# Patient Record
Sex: Female | Born: 1980 | Race: White | Hispanic: No | Marital: Single | Smoking: Current every day smoker
Health system: Southern US, Community
[De-identification: ages and names within clinical notes are randomized; demographics above are authoritative.]

## PROBLEM LIST (undated history)

## (undated) DIAGNOSIS — F32A Depression, unspecified: Secondary | ICD-10-CM

## (undated) DIAGNOSIS — Z8041 Family history of malignant neoplasm of ovary: Secondary | ICD-10-CM

## (undated) DIAGNOSIS — F329 Major depressive disorder, single episode, unspecified: Secondary | ICD-10-CM

## (undated) DIAGNOSIS — Z803 Family history of malignant neoplasm of breast: Secondary | ICD-10-CM

## (undated) DIAGNOSIS — Z8042 Family history of malignant neoplasm of prostate: Secondary | ICD-10-CM

## (undated) DIAGNOSIS — R51 Headache: Secondary | ICD-10-CM

## (undated) DIAGNOSIS — Z800 Family history of malignant neoplasm of digestive organs: Secondary | ICD-10-CM

## (undated) DIAGNOSIS — F419 Anxiety disorder, unspecified: Secondary | ICD-10-CM

## (undated) DIAGNOSIS — R519 Headache, unspecified: Secondary | ICD-10-CM

## (undated) HISTORY — DX: Family history of malignant neoplasm of digestive organs: Z80.0

## (undated) HISTORY — DX: Family history of malignant neoplasm of breast: Z80.3

## (undated) HISTORY — PX: WISDOM TOOTH EXTRACTION: SHX21

## (undated) HISTORY — DX: Family history of malignant neoplasm of prostate: Z80.42

## (undated) HISTORY — DX: Family history of malignant neoplasm of ovary: Z80.41

---
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
## 2000-12-29 ENCOUNTER — Other Ambulatory Visit: Admission: RE | Admit: 2000-12-29 | Discharge: 2000-12-29 | Payer: Self-pay | Admitting: Obstetrics and Gynecology

## 2001-03-26 ENCOUNTER — Inpatient Hospital Stay (HOSPITAL_COMMUNITY): Admission: AD | Admit: 2001-03-26 | Discharge: 2001-03-26 | Payer: Self-pay | Admitting: Obstetrics and Gynecology

## 2001-05-11 ENCOUNTER — Inpatient Hospital Stay (HOSPITAL_COMMUNITY): Admission: AD | Admit: 2001-05-11 | Discharge: 2001-05-14 | Payer: Self-pay | Admitting: Obstetrics and Gynecology

## 2001-05-16 ENCOUNTER — Encounter: Admission: RE | Admit: 2001-05-16 | Discharge: 2001-06-15 | Payer: Self-pay | Admitting: Obstetrics and Gynecology

## 2004-01-02 ENCOUNTER — Other Ambulatory Visit: Admission: RE | Admit: 2004-01-02 | Discharge: 2004-01-02 | Payer: Self-pay | Admitting: *Deleted

## 2005-03-08 ENCOUNTER — Other Ambulatory Visit: Admission: RE | Admit: 2005-03-08 | Discharge: 2005-03-08 | Payer: Self-pay | Admitting: Family Medicine

## 2007-06-14 ENCOUNTER — Encounter: Admission: RE | Admit: 2007-06-14 | Discharge: 2007-06-14 | Payer: Self-pay | Admitting: Family Medicine

## 2007-08-10 ENCOUNTER — Emergency Department (HOSPITAL_COMMUNITY): Admission: EM | Admit: 2007-08-10 | Discharge: 2007-08-10 | Payer: Self-pay | Admitting: Emergency Medicine

## 2007-10-17 ENCOUNTER — Encounter: Admission: RE | Admit: 2007-10-17 | Discharge: 2007-10-17 | Payer: Self-pay | Admitting: Family Medicine

## 2010-08-23 ENCOUNTER — Encounter: Payer: Self-pay | Admitting: Family Medicine

## 2010-12-18 NOTE — H&P (Signed)
[facility]  Patient:    Sara [name], Sara [name] Visit Number: [id number] MRN: [id number]          Service Type: OBS Location: [facility] Attending Physician:  Jaymes Graff A Dictated by:   Saverio Danker, C.N.M. Admit Date:  05/11/2001                           History and Physical  HISTORY OF PRESENT ILLNESS:     Sara [name] is a 30 year old single white female gravida 1, para 0 at 40-1/7 weeks by ultrasound who presents complaining of uterine contractions every three minutes since about 5 p.m. today.  She denies any headaches or visual disturbances.  She reports positive fetal movement.  She reports vomiting several times this evening and some bloody show throughout the day after being examined in the office.  She was evaluated in the office for labor symptoms earlier and was 1 cm and about 70% effaced.  Her pregnancy has been followed at [facility] by the certified nurse midwife service and has been at risk for: (1) Late to care. (2) History of depression.  (3) First trimester bleeding.  Her group B strep is negative.  OBSTETRICAL HISTORY:            She is a prima gravida with a history of an last menstrual period of September 01, 2000, though her cycles have also been irregular and her [facility] by ultrasound on Dec 26, 2000, gave her an [facility] of May 10, 2001.  GYNECOLOGIC HISTORY:            She reports a history of bacterial vaginosis.  ALLERGIES:                      She has no known drug allergies.  PAST MEDICAL HISTORY:           She reports having had the usual childhood diseases.  She reports occasional urinary tract infections, history of migraines occasionally, history of depression in the past and has been on Prozac and Zoloft since 1998, prescribed by Dr. Jeanie Sewer.  She smoked prior to this pregnancy and has a history of using alcohol and marijuana in the past but none since knowing she was pregnant.  FAMILY HISTORY:                  Her family history is significant for multiple relatives with hypertension.  Father and maternal grandfather with diabetes. Maternal grandmother with rheumatoid arthritis.  Paternal grandfather with pancreatic cancer.  Maternal grandmother with breast cancer.  Multiple relatives with migraines.  GENETIC HISTORY:                Negative.  SOCIAL HISTORY:                 She is single.  Her mother is very involved and supportive.  The father of the baby is [employer], and he is also involved and supportive.  She is of the [place] faith.  She denies any illicit drug use, alcohol or smoking since knowing she was pregnant.  She admits to possibly being exposed prior to knowing she was pregnant.  PRENATAL LABORATORY DATA:       Her blood type is O positive.  Her antibody screen is negative.  Syphilis is nonreactive.  Rubella is positive.  Hepatitis B surface antigen is negative.  HIV is nonreactive.  GC and Chlamydia  are both negative.  Pap was within normal limits.  One hour Glucola is within normal range.  Maternal serum alpha fetoprotein is also within normal range.  Her 36 week beta strep was negative.  PHYSICAL EXAMINATION:  VITAL SIGNS:                    Her vital signs are stable.  She is afebrile.  HEENT:                          Grossly within normal limits.  HEART:                          Regular rate and rhythm.  CHEST:                          Clear.  BREASTS:                        Soft and nontender.  ABDOMEN:                        Gravid with uterine contractions every two minutes that are strong.  PELVIC:                         Her cervix is now 3 cm, 100%, vertex at a 0 station and with intact membranes.  She was 1.5 cm, 70% at 9:30 p.m.  EXTREMITIES:                    Her extremities are within normal limits.  ASSESSMENT:                     1. Intrauterine pregnancy at term.                                 2. Early labor.                                  3. Negative group B strep.                                 4. Nausea and vomiting.  PLAN:                           Admit to labor and delivery, to follow routine CNM orders and to give her IV fluids of LR with a 500 cc bolus. Dictated by:   Vance Gather Duplantis, C.N.M. Attending Physician:  Michael Litter DD:  05/11/01 TD:  05/11/01 Job: [id number]

## 2011-04-22 LAB — COMPREHENSIVE METABOLIC PANEL
ALT: 10
AST: 16
Albumin: 4.4
Alkaline Phosphatase: 64
BUN: 9
CO2: 27
Calcium: 9.7
Chloride: 103
Creatinine, Ser: 0.73
GFR calc Af Amer: >60
GFR calc non Af Amer: >60
Glucose, Bld: 88
Potassium: 3.8
Sodium: 139
Total Bilirubin: 1.1
Total Protein: 7.6

## 2011-04-22 LAB — DIFFERENTIAL
Basophils Absolute: 0
Basophils Relative: 0
Eosinophils Absolute: 0
Eosinophils Relative: 0
Lymphocytes Relative: 18
Lymphs Abs: 1.1
Monocytes Absolute: 0.3
Monocytes Relative: 4
Neutro Abs: 4.8
Neutrophils Relative %: 78 — ABNORMAL HIGH

## 2011-04-22 LAB — URINALYSIS, ROUTINE W REFLEX MICROSCOPIC
Glucose, UA: NEGATIVE
Hgb urine dipstick: NEGATIVE
Ketones, ur: 15 — AB
Nitrite: NEGATIVE
Protein, ur: NEGATIVE
Specific Gravity, Urine: 1.026
Urobilinogen, UA: 1
pH: 6.5

## 2011-04-22 LAB — CBC
HCT: 42.8
Hemoglobin: 14.8
MCHC: 34.6
MCV: 93.1
Platelets: 256
RBC: 4.59
RDW: 12.6
WBC: 6.2

## 2011-04-22 LAB — POCT PREGNANCY, URINE
Operator id: 231701
Preg Test, Ur: NEGATIVE

## 2011-04-22 LAB — URINE CULTURE
Colony Count: NO GROWTH
Culture: NO GROWTH

## 2011-04-22 LAB — LIPASE, BLOOD: Lipase: 24

## 2012-05-02 ENCOUNTER — Encounter: Payer: Self-pay | Admitting: Obstetrics and Gynecology

## 2012-05-10 ENCOUNTER — Encounter: Payer: 59 | Admitting: Obstetrics and Gynecology

## 2012-07-03 ENCOUNTER — Other Ambulatory Visit: Payer: Self-pay | Admitting: Family Medicine

## 2012-07-03 ENCOUNTER — Other Ambulatory Visit (HOSPITAL_COMMUNITY)
Admission: RE | Admit: 2012-07-03 | Discharge: 2012-07-03 | Disposition: A | Discharge status: Home / Self Care | Payer: 59 | Source: Ambulatory Visit | Attending: Family Medicine | Admitting: Family Medicine

## 2012-07-03 DIAGNOSIS — Z1151 Encounter for screening for human papillomavirus (HPV): Secondary | ICD-10-CM | POA: Insufficient documentation

## 2012-07-03 DIAGNOSIS — Z124 Encounter for screening for malignant neoplasm of cervix: Secondary | ICD-10-CM | POA: Insufficient documentation

## 2012-07-03 DIAGNOSIS — Z113 Encounter for screening for infections with a predominantly sexual mode of transmission: Secondary | ICD-10-CM | POA: Insufficient documentation

## 2017-03-09 ENCOUNTER — Encounter (HOSPITAL_COMMUNITY): Payer: Self-pay

## 2017-03-10 ENCOUNTER — Other Ambulatory Visit: Payer: Self-pay | Admitting: Obstetrics and Gynecology

## 2017-03-15 ENCOUNTER — Encounter (HOSPITAL_COMMUNITY): Payer: Self-pay

## 2017-03-15 ENCOUNTER — Ambulatory Visit (HOSPITAL_COMMUNITY): Payer: BLUE CROSS/BLUE SHIELD | Admitting: Anesthesiology

## 2017-03-15 ENCOUNTER — Encounter (HOSPITAL_COMMUNITY)
Admission: RE | Disposition: A | Discharge status: Home / Self Care | Payer: Self-pay | Source: Ambulatory Visit | Attending: Obstetrics and Gynecology

## 2017-03-15 ENCOUNTER — Ambulatory Visit (HOSPITAL_COMMUNITY)
Admission: RE | Admit: 2017-03-15 | Discharge: 2017-03-15 | Disposition: A | Discharge status: Home / Self Care | Payer: BLUE CROSS/BLUE SHIELD | Source: Ambulatory Visit | Attending: Obstetrics and Gynecology | Admitting: Obstetrics and Gynecology

## 2017-03-15 DIAGNOSIS — Z79899 Other long term (current) drug therapy: Secondary | ICD-10-CM | POA: Diagnosis not present

## 2017-03-15 DIAGNOSIS — Y838 Other surgical procedures as the cause of abnormal reaction of the patient, or of later complication, without mention of misadventure at the time of the procedure: Secondary | ICD-10-CM | POA: Diagnosis not present

## 2017-03-15 DIAGNOSIS — F17210 Nicotine dependence, cigarettes, uncomplicated: Secondary | ICD-10-CM | POA: Insufficient documentation

## 2017-03-15 DIAGNOSIS — F329 Major depressive disorder, single episode, unspecified: Secondary | ICD-10-CM | POA: Insufficient documentation

## 2017-03-15 DIAGNOSIS — F419 Anxiety disorder, unspecified: Secondary | ICD-10-CM | POA: Diagnosis not present

## 2017-03-15 DIAGNOSIS — T8339XA Other mechanical complication of intrauterine contraceptive device, initial encounter: Secondary | ICD-10-CM | POA: Insufficient documentation

## 2017-03-15 DIAGNOSIS — N840 Polyp of corpus uteri: Secondary | ICD-10-CM | POA: Insufficient documentation

## 2017-03-15 DIAGNOSIS — N920 Excessive and frequent menstruation with regular cycle: Secondary | ICD-10-CM | POA: Insufficient documentation

## 2017-03-15 HISTORY — PX: HYSTEROSCOPY WITH NOVASURE: SHX5574

## 2017-03-15 HISTORY — DX: Headache: R51

## 2017-03-15 HISTORY — DX: Depression, unspecified: F32.A

## 2017-03-15 HISTORY — DX: Headache, unspecified: R51.9

## 2017-03-15 HISTORY — DX: Anxiety disorder, unspecified: F41.9

## 2017-03-15 HISTORY — PX: HYSTEROSCOPY WITH D & C: SHX1775

## 2017-03-15 HISTORY — DX: Major depressive disorder, single episode, unspecified: F32.9

## 2017-03-15 LAB — CBC
HCT: 39 % (ref 36.0–46.0)
Hemoglobin: 12.9 g/dL (ref 12.0–15.0)
MCH: 29.9 pg (ref 26.0–34.0)
MCHC: 33.1 g/dL (ref 30.0–36.0)
MCV: 90.3 fL (ref 78.0–100.0)
Platelets: 285 10*3/uL (ref 150–400)
RBC: 4.32 MIL/uL (ref 3.87–5.11)
RDW: 13.5 % (ref 11.5–15.5)
WBC: 9.3 10*3/uL (ref 4.0–10.5)

## 2017-03-15 LAB — BASIC METABOLIC PANEL
Anion gap: 8 (ref 5–15)
BUN: 12 mg/dL (ref 6–20)
CO2: 25 mmol/L (ref 22–32)
Calcium: 9 mg/dL (ref 8.9–10.3)
Chloride: 105 mmol/L (ref 101–111)
Creatinine, Ser: 0.9 mg/dL (ref 0.44–1.00)
GFR calc Af Amer: >60 mL/min (ref >60)
GFR calc non Af Amer: >60 mL/min (ref >60)
Glucose, Bld: 85 mg/dL (ref 65–99)
Potassium: 3.9 mmol/L (ref 3.5–5.1)
Sodium: 138 mmol/L (ref 135–145)

## 2017-03-15 LAB — HCG, SERUM, QUALITATIVE: Preg, Serum: NEGATIVE

## 2017-03-15 SURGERY — DILATATION AND CURETTAGE /HYSTEROSCOPY
Anesthesia: General | Site: Vagina

## 2017-03-15 MED ORDER — PROPOFOL 10 MG/ML IV BOLUS
INTRAVENOUS | Status: AC
  Filled 2017-03-15: qty 20

## 2017-03-15 MED ORDER — ONDANSETRON HCL 4 MG/2ML IJ SOLN: 4.0000 mg | Freq: Once | INTRAMUSCULAR | Status: DC | PRN

## 2017-03-15 MED ORDER — MIDAZOLAM HCL 2 MG/2ML IJ SOLN
INTRAMUSCULAR | Status: AC
  Filled 2017-03-15: qty 2

## 2017-03-15 MED ORDER — LACTATED RINGERS IV SOLN
INTRAVENOUS | Status: DC
  Administered 2017-03-15: 14:00:00 via INTRAVENOUS

## 2017-03-15 MED ORDER — MEPERIDINE HCL 25 MG/ML IJ SOLN: 6.2500 mg | INTRAMUSCULAR | Status: DC | PRN

## 2017-03-15 MED ORDER — ONDANSETRON HCL 4 MG/2ML IJ SOLN
INTRAMUSCULAR | Status: DC | PRN
  Administered 2017-03-15: 4 mg via INTRAVENOUS

## 2017-03-15 MED ORDER — DEXAMETHASONE SODIUM PHOSPHATE 10 MG/ML IJ SOLN
INTRAMUSCULAR | Status: DC | PRN
  Administered 2017-03-15: 4 mg via INTRAVENOUS

## 2017-03-15 MED ORDER — VASOPRESSIN 20 UNIT/ML IV SOLN
INTRAVENOUS | Status: AC
  Filled 2017-03-15: qty 1

## 2017-03-15 MED ORDER — DEXAMETHASONE SODIUM PHOSPHATE 4 MG/ML IJ SOLN
INTRAMUSCULAR | Status: AC
  Filled 2017-03-15: qty 1

## 2017-03-15 MED ORDER — FENTANYL CITRATE (PF) 100 MCG/2ML IJ SOLN
INTRAMUSCULAR | Status: AC
  Filled 2017-03-15: qty 2

## 2017-03-15 MED ORDER — LIDOCAINE HCL (CARDIAC) 20 MG/ML IV SOLN
INTRAVENOUS | Status: DC | PRN
  Administered 2017-03-15: 100 mg via INTRAVENOUS

## 2017-03-15 MED ORDER — PROPOFOL 10 MG/ML IV BOLUS
INTRAVENOUS | Status: DC | PRN
  Administered 2017-03-15: 200 mg via INTRAVENOUS

## 2017-03-15 MED ORDER — CEFAZOLIN SODIUM-DEXTROSE 2-4 GM/100ML-% IV SOLN
2.0000 g | INTRAVENOUS | Status: AC
  Administered 2017-03-15: 2 g via INTRAVENOUS

## 2017-03-15 MED ORDER — SODIUM CHLORIDE 0.9 % IJ SOLN
INTRAMUSCULAR | Status: AC
  Filled 2017-03-15: qty 50

## 2017-03-15 MED ORDER — MIDAZOLAM HCL 2 MG/2ML IJ SOLN
INTRAMUSCULAR | Status: DC | PRN
Start: 2017-03-15 — End: 2017-03-15
  Administered 2017-03-15: 2 mg via INTRAVENOUS

## 2017-03-15 MED ORDER — CHLOROPROCAINE HCL 1 % IJ SOLN
INTRAMUSCULAR | Status: AC
  Filled 2017-03-15: qty 30

## 2017-03-15 MED ORDER — CEFAZOLIN SODIUM-DEXTROSE 2-4 GM/100ML-% IV SOLN
INTRAVENOUS | Status: AC
  Filled 2017-03-15: qty 100

## 2017-03-15 MED ORDER — SODIUM CHLORIDE 0.9 % IR SOLN
Status: DC | PRN
  Administered 2017-03-15: 3000 mL

## 2017-03-15 MED ORDER — FENTANYL CITRATE (PF) 100 MCG/2ML IJ SOLN: 25.0000 ug | INTRAMUSCULAR | Status: DC | PRN

## 2017-03-15 MED ORDER — LIDOCAINE HCL (CARDIAC) 20 MG/ML IV SOLN
INTRAVENOUS | Status: AC
  Filled 2017-03-15: qty 5

## 2017-03-15 MED ORDER — KETOROLAC TROMETHAMINE 30 MG/ML IJ SOLN
INTRAMUSCULAR | Status: DC | PRN
  Administered 2017-03-15: 30 mg via INTRAVENOUS

## 2017-03-15 MED ORDER — BUPIVACAINE HCL (PF) 0.25 % IJ SOLN
INTRAMUSCULAR | Status: DC | PRN
  Administered 2017-03-15: 20 mL

## 2017-03-15 MED ORDER — SCOPOLAMINE 1 MG/3DAYS TD PT72
1.0000 | MEDICATED_PATCH | Freq: Once | TRANSDERMAL | Status: DC
  Administered 2017-03-15: 1.5 mg via TRANSDERMAL

## 2017-03-15 MED ORDER — FENTANYL CITRATE (PF) 100 MCG/2ML IJ SOLN
INTRAMUSCULAR | Status: DC | PRN
Start: 2017-03-15 — End: 2017-03-15
  Administered 2017-03-15: 100 ug via INTRAVENOUS
  Administered 2017-03-15: 50 ug via INTRAVENOUS

## 2017-03-15 MED ORDER — ONDANSETRON HCL 4 MG/2ML IJ SOLN
INTRAMUSCULAR | Status: AC
  Filled 2017-03-15: qty 2

## 2017-03-15 MED ORDER — SCOPOLAMINE 1 MG/3DAYS TD PT72
MEDICATED_PATCH | TRANSDERMAL | Status: DC
Start: 2017-03-15 — End: 2017-03-15
  Administered 2017-03-15: 1.5 mg via TRANSDERMAL
  Filled 2017-03-15: qty 1

## 2017-03-15 MED ORDER — TRAMADOL HCL 50 MG PO TABS: 50.0000 mg | ORAL_TABLET | Freq: Four times a day (QID) | ORAL | 0 refills | Status: AC | PRN

## 2017-03-15 MED ORDER — BUPIVACAINE HCL (PF) 0.25 % IJ SOLN
INTRAMUSCULAR | Status: AC
  Filled 2017-03-15: qty 30

## 2017-03-15 SURGICAL SUPPLY — 16 items
ABLATOR ENDOMETRIAL BIPOLAR (ABLATOR) ×2 IMPLANT
CANISTER SUCT 3000ML PPV (MISCELLANEOUS) ×2 IMPLANT
CATH ROBINSON RED A/P 16FR (CATHETERS) ×2 IMPLANT
CLOTH BEACON ORANGE TIMEOUT ST (SAFETY) ×2 IMPLANT
CONTAINER PREFILL 10% NBF 60ML (FORM) ×3 IMPLANT
GLOVE BIO SURGEON STRL SZ7.5 (GLOVE) ×2 IMPLANT
GLOVE BIOGEL PI IND STRL 7.0 (GLOVE) ×1 IMPLANT
GLOVE BIOGEL PI INDICATOR 7.0 (GLOVE) ×1
GOWN STRL REUS W/TWL LRG LVL3 (GOWN DISPOSABLE) ×5 IMPLANT
PACK VAGINAL MINOR WOMEN LF (CUSTOM PROCEDURE TRAY) ×2 IMPLANT
PAD OB MATERNITY 4.3X12.25 (PERSONAL CARE ITEMS) ×2 IMPLANT
PAD PREP 24X48 CUFFED NSTRL (MISCELLANEOUS) ×2 IMPLANT
SYR TB 1ML 25GX5/8 (SYRINGE) ×2 IMPLANT
TOWEL OR 17X24 6PK STRL BLUE (TOWEL DISPOSABLE) ×4 IMPLANT
TUBING AQUILEX INFLOW (TUBING) ×2 IMPLANT
TUBING AQUILEX OUTFLOW (TUBING) ×2 IMPLANT

## 2017-03-15 NOTE — Anesthesia Postprocedure Evaluation (Signed)
Anesthesia Post Note  Patient: Sara [name]  Procedure(s) Performed: Procedure(s) (LRB): DILATATION AND CURETTAGE /HYSTEROSCOPY (N/A) HYSTEROSCOPY WITH NOVASURE (N/A)     Patient location during evaluation: PACU Anesthesia Type: General Level of consciousness: awake and alert Pain management: pain level controlled Vital Signs Assessment: post-procedure vital signs reviewed and stable Respiratory status: spontaneous breathing, nonlabored ventilation, respiratory function stable and patient connected to nasal cannula oxygen Cardiovascular status: blood pressure returned to baseline and stable Postop Assessment: no signs of nausea or vomiting Anesthetic complications: no    Last Vitals:  Vitals:   03/15/17 1530 03/15/17 1545  BP: 115/77 119/83  Pulse: 73 78  Resp: 15 19  Temp:  37.4 C  SpO2: 98% 99%    Last Pain:  Vitals:   03/15/17 1545  TempSrc:   PainSc: 1    Pain Goal: Patients Stated Pain Goal: 3 (03/15/17 1545)               Deaire Mcwhirter

## 2017-03-15 NOTE — Op Note (Signed)
NAMTheodoro [name]:  [name], Sara            ACCOUNT NO.:  [id number]  MEDICAL RECORD NO.:  [id number]  LOCATION:                                 FACILITY:  PHYSICIAN:  Lenoard Adenichard J. Alka Falwell, M.D.     DATE OF BIRTH:  DATE OF PROCEDURE:  03/15/2017 DATE OF DISCHARGE:                              OPERATIVE REPORT   PREOPERATIVE DIAGNOSES: 1. Imbedded intrauterine device. 2. Refractory menorrhagia.  POSTOPERATIVE DIAGNOSES: 1. No evidence of the intrauterine device. 2. Menorrhagia. 3. Endometrial polyp.  PROCEDURES: 1. Diagnostic hysteroscopy. 2. D and C. 3. Removal of endometrial polyp. 4. NovaSure endometrial ablation.  SURGEON:  Lenoard Adenichard J. Mayjor Ager, M.D.  ASSISTANT:  None.  ANESTHESIA:  General.  ESTIMATED BLOOD LOSS:  Less than 50 mL.  COMPLICATIONS:  None.  DRAINS:  None.  COUNTS:  Correct.  SPECIMEN:  Endometrial curettings with polyp to Pathology.  DISPOSITION:  The patient to recovery in good condition.  BRIEF OPERATIVE NOTE:  After being apprised of the risks of anesthesia, infection, bleeding, and surrounding organs, possible need for repair, delayed versus immediate complications to include bowel and bladder injury, possible need for repair, the patient was brought to the operating room where she was administered general anesthetic without complications.  Prepped and draped in usual sterile fashion. Catheterized until the bladder was empty.  Exam under anesthesia reveals normal-appearing cervix.  Dilute Marcaine solution placed.  Standard paracervical block, 20 mL total.  Cervix easily dilated up to a #25 Pratt dilator.  Hysteroscope placed.  As noted previously, there were attempts to remove a previously placed IUD in the office.  This IUD removal was incomplete with spontaneous breakage of the IUD during the process of removal.  The IUD was not seen under ultrasound.  Therefore, during hysteroscopy, the intention was to remove, but upon visualization of the  endometrial cavity, a posterior wall polyp and no evidence of a retained IUD or imbedded IUD is noted.  At this time, curettage using sharp curettage in a 4-quadrant method was performed and repeat visualization reveals a normal-appearing endometrial cavity after removal of the polyp which was removed using polyp forceps.  Bilateral normal tubal ostia.  No evidence of perforation.  No evidence of retained IUD device.  Therefore, at this time, decision was made to proceed with NovaSure endometrial ablation, whereby the device was seated to a length of 6, a width of 3.5 initiated for 1 minute and 5 seconds to a wattage of 116 watts without difficulty.  After a negative CO2 test, the device was then removed, inspected, and found to be intact.  Repeat visualization of endometrial cavity revealed a normal- appearing endometrial cavity.  No evidence of perforation.  No evidence of IUD.  At this time, procedure was terminated.  Fluid deficit of 120 mL noted.  Minimal blood loss.  The patient tolerated the procedure well, was awakened, and transferred to the recovery in good condition.     Lenoard Adenichard J. Eluterio Seymour, M.D.     RJT/MEDQ  D:  03/15/2017  T:  03/15/2017  Job:  [id number]  cc:   Lenoard Adenichard J. Dharma Pare, M.D. Fax: [phone]

## 2017-03-15 NOTE — Op Note (Signed)
03/15/2017  2:41 PM  PATIENT:  Sara GripElizabeth Jeana [name]  36 y.o. female  PRE-OPERATIVE DIAGNOSIS:  Embedded Intra-Uterine Device, Menorrhagia  POST-OPERATIVE DIAGNOSIS:  Embedded Intra-Uterine Device, Menorrhagia  PROCEDURE:  Procedure(s): DILATATION AND CURETTAGE HYSTEROSCOPY WITH NOVASURE ENDOMETRIAL POLYPECTOMY  SURGEON:  Surgeon(s): Olivia Mackieaavon, Inika Bellanger, MD  ASSISTANTS: none   ANESTHESIA:   local and general  ESTIMATED BLOOD LOSS: MINIMAL  DRAINS: none   LOCAL MEDICATIONS USED:  MARCAINE    and Amount: 20 ml  SPECIMEN:  Source of Specimen:  EMC WITH POLYP  DISPOSITION OF SPECIMEN:  PATHOLOGY  COUNTS:  YES  DICTATION #: [id number]  PLAN OF CARE: DC HOME  PATIENT DISPOSITION:  PACU - hemodynamically stable.

## 2017-03-15 NOTE — Anesthesia Procedure Notes (Signed)
Procedure Name: LMA Insertion Date/Time: 03/15/2017 2:08 PM Performed by: Mckinzey Entwistle, Jannet AskewHARLESETTA M Pre-anesthesia Checklist: Patient identified, Emergency Drugs available, Suction available, Patient being monitored and Timeout performed Patient Re-evaluated:Patient Re-evaluated prior to induction Oxygen Delivery Method: Circle system utilized Preoxygenation: Pre-oxygenation with 100% oxygen Induction Type: IV induction LMA: LMA inserted LMA Size: 4.0 Number of attempts: 1 Placement Confirmation: ETT inserted through vocal cords under direct vision,  positive ETCO2 and breath sounds checked- equal and bilateral ETT to lip (cm): at lip. Dental Injury: Teeth and Oropharynx as per pre-operative assessment

## 2017-03-15 NOTE — Transfer of Care (Signed)
Immediate Anesthesia Transfer of Care Note  Patient: Sara [name]  Procedure(s) Performed: Procedure(s): DILATATION AND CURETTAGE /HYSTEROSCOPY (N/A) HYSTEROSCOPY WITH NOVASURE (N/A)  Patient Location: PACU  Anesthesia Type:General  Level of Consciousness: awake, alert  and oriented  Airway & Oxygen Therapy: Patient Spontanous Breathing and Patient connected to nasal cannula oxygen  Post-op Assessment: Report given to RN and Post -op Vital signs reviewed and stable  Post vital signs: Reviewed and stable  Last Vitals:  Vitals:   03/15/17 1237  BP: 126/85  Pulse: 85  Resp: 16  Temp: 36.8 C  SpO2: 99%    Last Pain:  Vitals:   03/15/17 1237  TempSrc: Oral      Patients Stated Pain Goal: 3 (03/15/17 1253)  Complications: No apparent anesthesia complications

## 2017-03-15 NOTE — Discharge Instructions (Signed)
DISCHARGE INSTRUCTIONS: HYSTEROSCOPY / ENDOMETRIAL ABLATION °The following instructions have been prepared to help you care for yourself upon your return home. ° °May Remove Scop patch on or before ° °May take Ibuprofen after ° °May take stool softner while taking narcotic pain medication to prevent constipation.  Drink plenty of water. ° °Personal hygiene: °• Use sanitary pads for vaginal drainage, not tampons. °• Shower the day after your procedure. °• NO tub baths, pools or Jacuzzis for 2-3 weeks. °• Wipe front to back after using the bathroom. ° °Activity and limitations: °• Do NOT drive or operate any equipment for 24 hours. The effects of anesthesia are still present °and drowsiness may result. °• Do NOT rest in bed all day. °• Walking is encouraged. °• Walk up and down stairs slowly. °• You may resume your normal activity in one to two days or as indicated by your physician. °Sexual activity: NO intercourse for at least 2 weeks after the procedure, or as indicated by your °Doctor. ° °Diet: Eat a light meal as desired this evening. You may resume your usual diet tomorrow. ° °Return to Work: You may resume your work activities in one to two days or as indicated by your °Doctor. ° °What to expect after your surgery: Expect to have vaginal bleeding/discharge for 2-3 days and °spotting for up to 10 days. It is not unusual to have soreness for up to 1-2 weeks. You may have a °slight burning sensation when you urinate for the first day. Mild cramps may continue for a couple of °days. You may have a regular period in 2-6 weeks. ° °Call your doctor for any of the following: °• Excessive vaginal bleeding or clotting, saturating and changing one pad every hour. °• Inability to urinate 6 hours after discharge from hospital. °• Pain not relieved by pain medication. °• Fever of 100.4° F or greater. °• Unusual vaginal discharge or odor. ° °Return to office _________________Call for an appointment  ___________________ °Patient’s signature: ______________________ °Nurse’s signature ________________________ ° °Post Anesthesia Care Unit [phone] °Post Anesthesia Home Care Instructions ° °Activity: °Get plenty of rest for the remainder of the day. A responsible individual must stay with you for 24 hours following the procedure.  °For the next 24 hours, DO NOT: °-Drive a car °-Operate machinery °-Drink alcoholic beverages °-Take any medication unless instructed by your physician °-Make any legal decisions or sign important papers. ° °Meals: °Start with liquid foods such as gelatin or soup. Progress to regular foods as tolerated. Avoid greasy, spicy, heavy foods. If nausea and/or vomiting occur, drink only clear liquids until the nausea and/or vomiting subsides. Call your physician if vomiting continues. ° °Special Instructions/Symptoms: °Your throat may feel dry or sore from the anesthesia or the breathing tube placed in your throat during surgery. If this causes discomfort, gargle with warm salt water. The discomfort should disappear within 24 hours. ° °If you had a scopolamine patch placed behind your ear for the management of post- operative nausea and/or vomiting: ° °1. The medication in the patch is effective for 72 hours, after which it should be removed.  Wrap patch in a tissue and discard in the trash. Wash hands thoroughly with soap and water. °2. You may remove the patch earlier than 72 hours if you experience unpleasant side effects which may include dry mouth, dizziness or visual disturbances. °3. Avoid touching the patch. Wash your hands with soap and water after contact with the patch. °  ° °

## 2017-03-15 NOTE — Anesthesia Preprocedure Evaluation (Addendum)
Anesthesia Evaluation  Patient identified by MRN, date of birth, ID band Patient awake    Reviewed: Allergy & Precautions, H&P , Patient's Chart, lab work & pertinent test results  History of Anesthesia Complications (+) history of anesthetic complications  Airway Mallampati: I  TM Distance: >3 FB Neck ROM: Full    Dental no notable dental hx. (+) Teeth Intact   Pulmonary neg pulmonary ROS, Current Smoker,    Pulmonary exam normal breath sounds clear to auscultation       Cardiovascular negative cardio ROS Normal cardiovascular exam Rhythm:Regular Rate:Normal     Neuro/Psych negative neurological ROS  negative psych ROS   GI/Hepatic negative GI ROS, Neg liver ROS,   Endo/Other  negative endocrine ROS  Renal/GU negative Renal ROS     Musculoskeletal   Abdominal   Peds  Hematology negative hematology ROS (+)   Anesthesia Other Findings   Reproductive/Obstetrics (+) Pregnancy                            Anesthesia Physical Anesthesia Plan  ASA: II  Anesthesia Plan: General   Post-op Pain Management:    Induction: Intravenous  PONV Risk Score and Plan: 2 and Ondansetron, Dexamethasone, Midazolam and Treatment may vary due to age or medical condition  Airway Management Planned: LMA  Additional Equipment:   Intra-op Plan:   Post-operative Plan:   Informed Consent: I have reviewed the patients History and Physical, chart, labs and discussed the procedure including the risks, benefits and alternatives for the proposed anesthesia with the patient or authorized representative who has indicated his/her understanding and acceptance.     Plan Discussed with: Anesthesiologist  Anesthesia Plan Comments: ( )       Anesthesia Quick Evaluation

## 2017-03-15 NOTE — Progress Notes (Signed)
Patient seen and examined. Consent witnessed and signed. No changes noted. Update completed. Patient ID: Sara [name], female   DOB: 11-24-1980, 36 y.o.   MRN: [id number]

## 2017-03-15 NOTE — H&P (Signed)
Sara [name] is an 36 y.o. female. Refractory menorrhagia and embedded IUD for removal and EAB  Pertinent Gynecological History: Menses: flow is moderate Bleeding: dysfunctional uterine bleeding Contraception: none DES exposure: denies Blood transfusions: none Sexually transmitted diseases: no past history Previous GYN Procedures: na  Last mammogram: na Date: na Last pap: normal Date: 2018 OB History: G1, P1   Menstrual History: Menarche age: 89 Patient's last menstrual period was 02/07/2017 (approximate).    Past Medical History:  Diagnosis Date  . Anxiety   . Depression   . Headache    Migraines    Past Surgical History:  Procedure Laterality Date  . WISDOM TOOTH EXTRACTION      History reviewed. No pertinent family history.  Social History:  reports that she has been smoking Cigarettes.  She has a 7.50 pack-year smoking history. She has never used smokeless tobacco. She reports that she drinks alcohol. She reports that she does not use drugs.  Allergies: No Known Allergies  Prescriptions Prior to Admission  Medication Sig Dispense Refill Last Dose  . buPROPion (WELLBUTRIN XL) 150 MG 24 hr tablet Take 1 tablet by mouth daily.  0 03/15/2017 at 0630  . fexofenadine (ALLEGRA) 180 MG tablet Take 180 mg by mouth daily.   03/14/2017 at Unknown time  . fluticasone (FLONASE) 50 MCG/ACT nasal spray Place 1 spray into both nostrils daily.  1 03/14/2017 at Unknown time  . ibuprofen (ADVIL,MOTRIN) 200 MG tablet Take 200 mg by mouth every 6 (six) hours as needed.   Past Week at Unknown time  . venlafaxine XR (EFFEXOR-XR) 75 MG 24 hr capsule Take 1 capsule by mouth every evening.  0 03/14/2017 at Unknown time    Review of Systems  Constitutional: Negative.   All other systems reviewed and are negative.   Blood pressure 126/85, pulse 85, temperature 98.3 F (36.8 C), temperature source Oral, resp. rate 16, height 5\' 3"  (1.6 m), weight 81.6 kg (180 lb), last menstrual  period 02/07/2017, SpO2 99 %. Physical Exam  Nursing note and vitals reviewed. Constitutional: She is oriented to person, place, and time. She appears well-developed and well-nourished.  HENT:  Head: Normocephalic and atraumatic.  Neck: Normal range of motion. Neck supple.  Cardiovascular: Normal rate and regular rhythm.   Respiratory: Effort normal and breath sounds normal.  GI: Soft. Bowel sounds are normal.  Genitourinary: Vagina normal and uterus normal.  Musculoskeletal: Normal range of motion.  Neurological: She is alert and oriented to person, place, and time. She has normal reflexes.  Skin: Skin is warm and dry.  Psychiatric: She has a normal mood and affect.    Results for orders placed or performed during the hospital encounter of 03/15/17 (from the past 24 hour(s))  CBC     Status: None   Collection Time: 03/15/17 12:30 PM  Result Value Ref Range   WBC 9.3 4.0 - 10.5 K/uL   RBC 4.32 3.87 - 5.11 MIL/uL   Hemoglobin 12.9 12.0 - 15.0 g/dL   HCT 84.139.0 32.436.0 - 40.146.0 %   MCV 90.3 78.0 - 100.0 fL   MCH 29.9 26.0 - 34.0 pg   MCHC 33.1 30.0 - 36.0 g/dL   RDW 02.713.5 25.311.5 - 66.415.5 %   Platelets 285 150 - 400 K/uL    No results found.  Assessment/Plan: Refractory Menorrhagia Embedded IUD Diag HS, DNC, IUD removal , EAB Consent done.  Sheriece Jefcoat J 03/15/2017, 1:13 PM

## 2017-03-16 ENCOUNTER — Encounter (HOSPITAL_COMMUNITY): Payer: Self-pay | Admitting: Obstetrics and Gynecology

## 2018-06-21 ENCOUNTER — Other Ambulatory Visit: Payer: Self-pay | Admitting: Family Medicine

## 2018-06-21 DIAGNOSIS — N6310 Unspecified lump in the right breast, unspecified quadrant: Secondary | ICD-10-CM

## 2018-07-04 ENCOUNTER — Ambulatory Visit
Admission: RE | Admit: 2018-07-04 | Discharge: 2018-07-04 | Disposition: A | Discharge status: Home / Self Care | Payer: Medicaid Other | Source: Ambulatory Visit | Attending: Family Medicine | Admitting: Family Medicine

## 2018-07-04 ENCOUNTER — Ambulatory Visit
Admission: RE | Admit: 2018-07-04 | Discharge: 2018-07-04 | Disposition: A | Discharge status: Home / Self Care | Payer: BLUE CROSS/BLUE SHIELD | Source: Ambulatory Visit | Attending: Family Medicine | Admitting: Family Medicine

## 2018-07-04 DIAGNOSIS — N6310 Unspecified lump in the right breast, unspecified quadrant: Secondary | ICD-10-CM

## 2020-08-21 IMAGING — MG DIGITAL DIAGNOSTIC BILATERAL MAMMOGRAM WITH TOMO AND CAD
8 of 14 series · 8 of 40 positions shown · non-contrast
Comparison: None.

CLINICAL DATA: 37-year-old female presenting for evaluation of a
palpable lump in the right breast. This is the patient's first
mammogram.

EXAM:
DIGITAL DIAGNOSTIC BILATERAL MAMMOGRAM WITH CAD AND TOMO
ULTRASOUND RIGHT BREAST

[L MLO synth-2D]
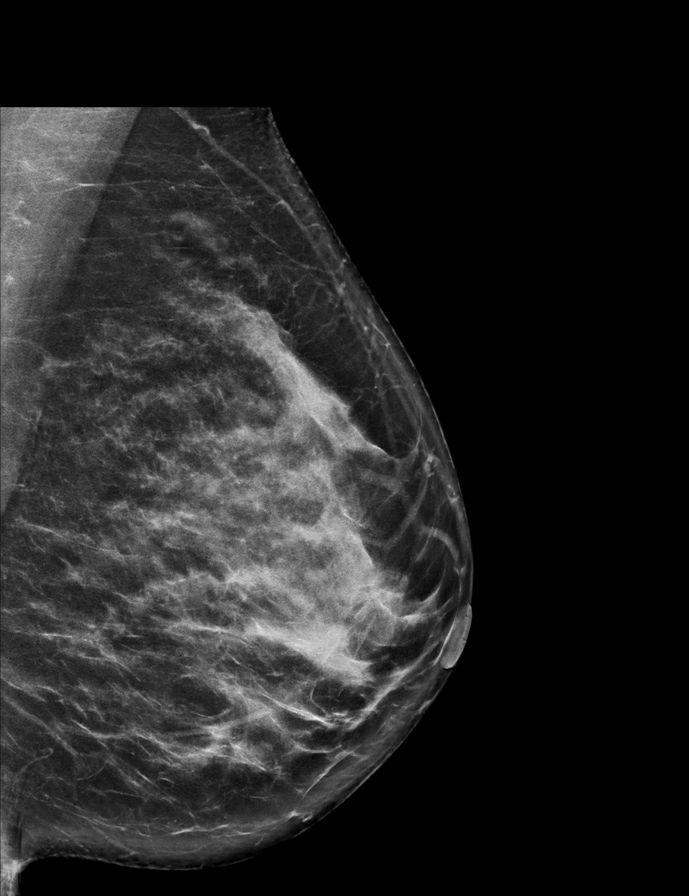

[R TAN synth-2D]
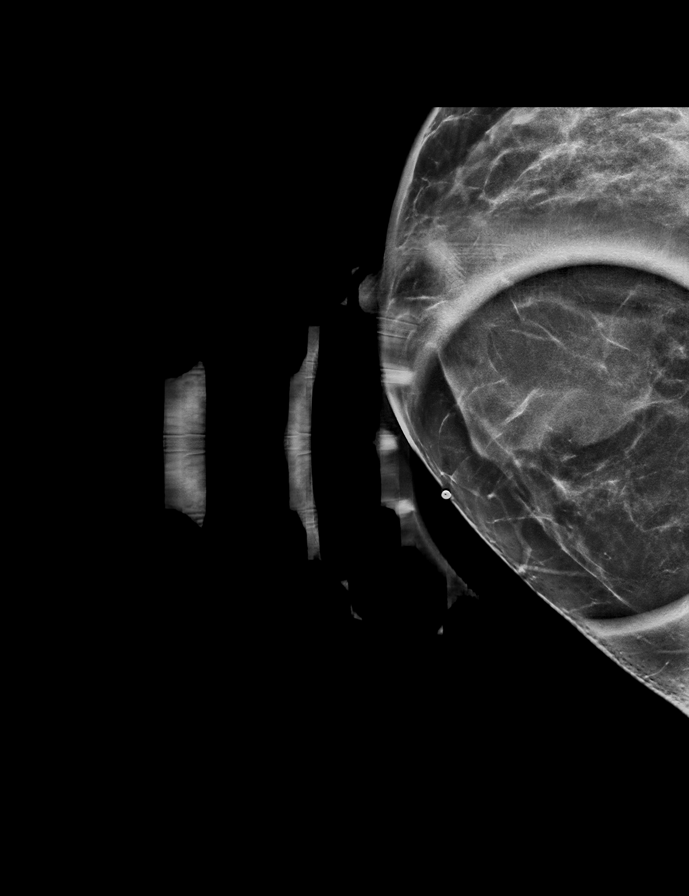

[L CC synth-2D (1 of 3)]
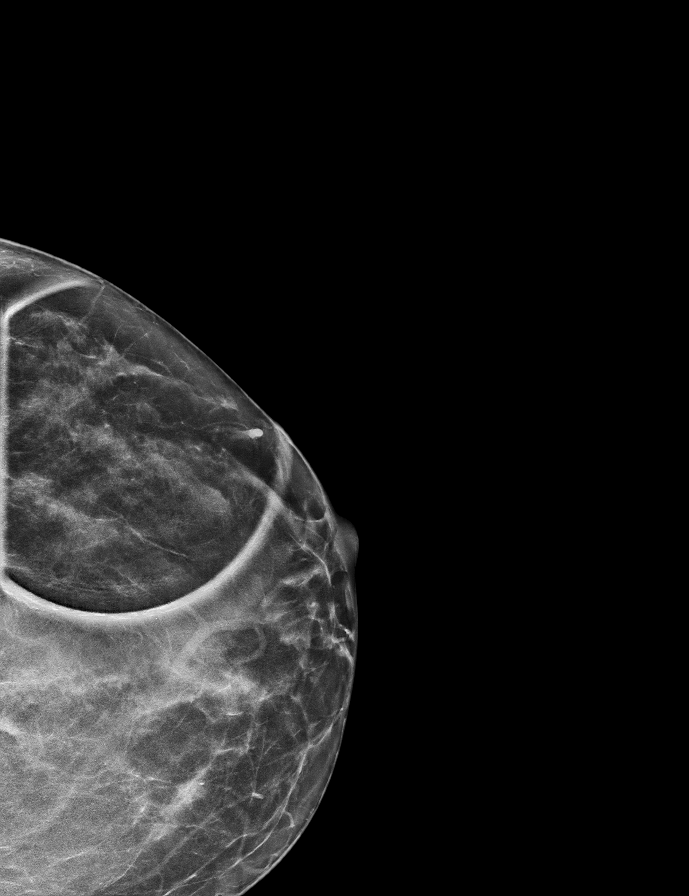

[L CC synth-2D (2 of 3)]
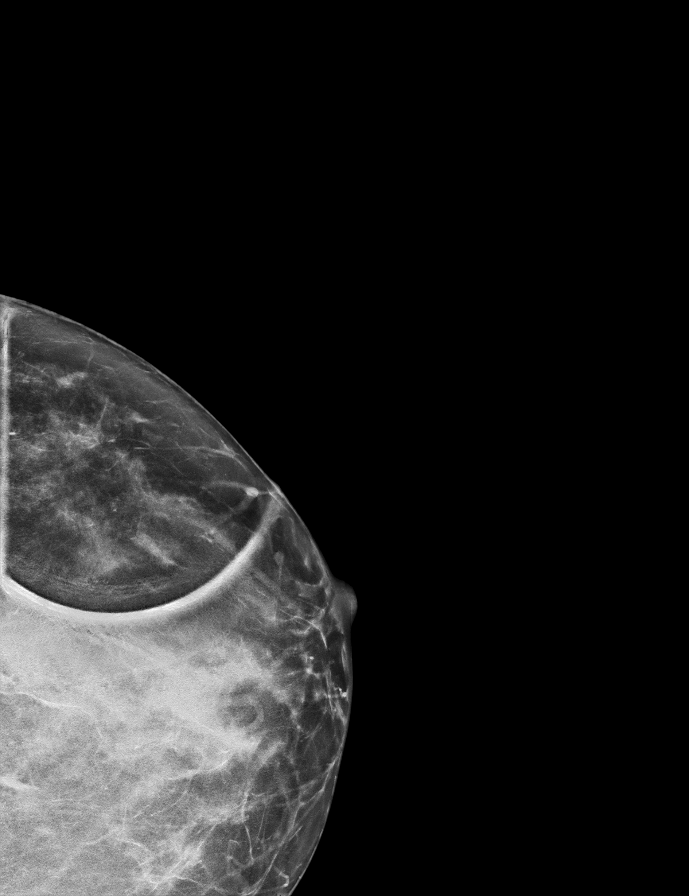

[R CC synth-2D]
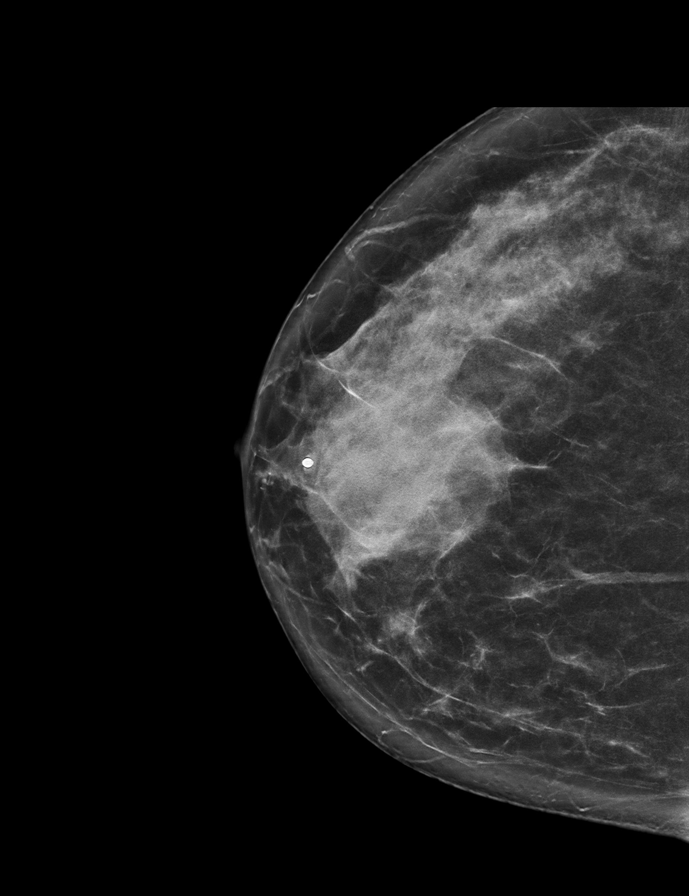

[R MLO synth-2D]
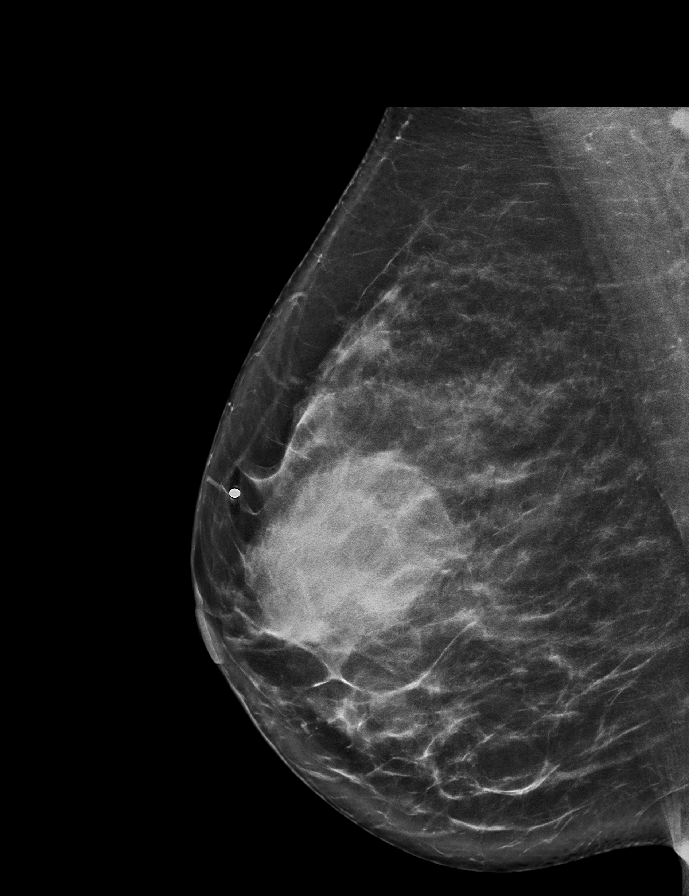

[L CC synth-2D (3 of 3)]
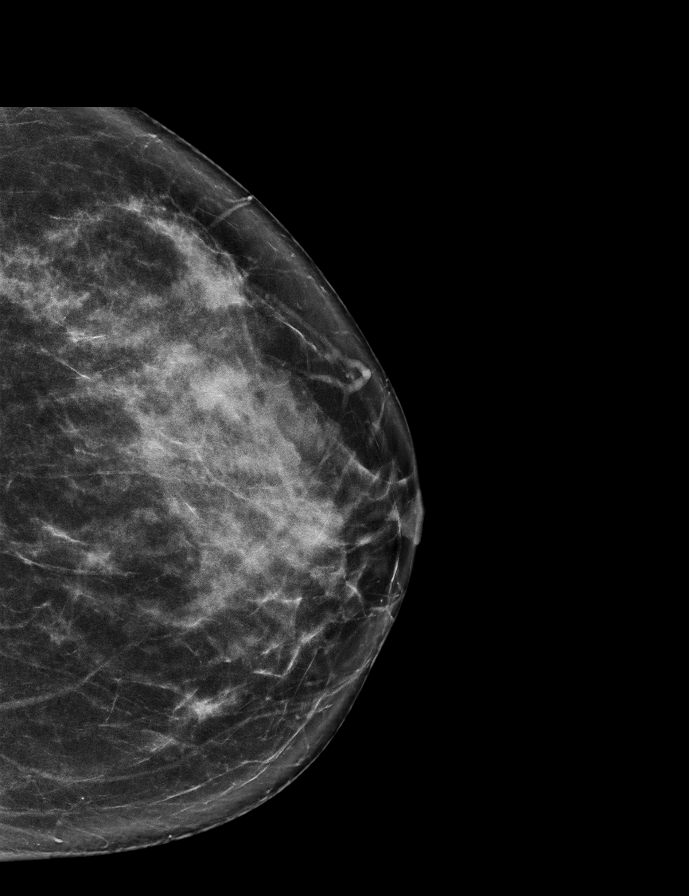

[L CC tomo · tomo slice 30/59.0]
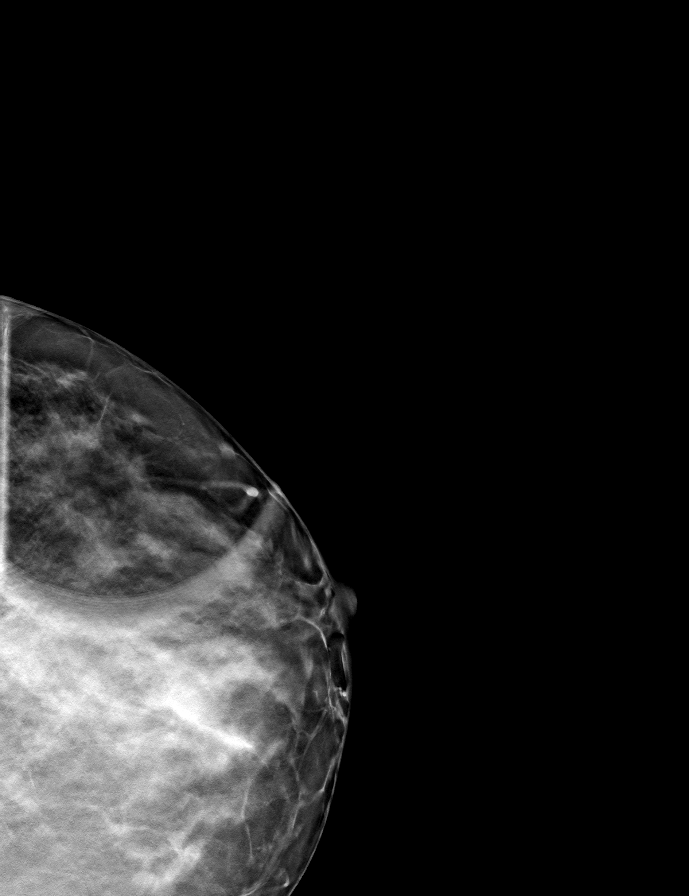

[8 of 40 positions shown; findings below may reference images not displayed]

ACR Breast Density Category c: The breast tissue is heterogeneously
dense, which may obscure small masses.
FINDINGS: There is a large oval circumscribed mass immediately deep to the
palpable marker which has been placed just superior to the right
nipple. Inferior and lateral to the larger mass there is a 1.1 cm
oval circumscribed mass. No other suspicious calcifications, masses
or areas of distortion are seen in the bilateral breasts.

Mammographic images were processed with CAD.

Ultrasound of the right breast at 2 o'clock, 3 cm from the nipple
demonstrates a large anechoic oval circumscribed mass accounting for
the palpable site of concern. This cyst measures 4.6 x 2.9 x 4.1 cm.
There are several other adjacent cysts. The largest of these is at 6
o'clock, 1 cm from the nipple and accounts for the second mass
identified mammographically.
IMPRESSION: 1. The palpable lump in the right breast corresponds with a large
benign cyst.

2.  No mammographic evidence of malignancy in the bilateral breasts.

RECOMMENDATION:
1. Ultrasound-guided aspiration was offered, but declined by the
patient at this time.

2. Screening mammogram at age 40 unless there are persistent or
intervening clinical concerns. (Code:KZ-[id number])

I have discussed the findings and recommendations with the patient.
Results were also provided in writing at the conclusion of the
visit. If applicable, a reminder letter will be sent to the patient
regarding the next appointment.

BI-RADS CATEGORY  2: Benign.

## 2022-05-25 ENCOUNTER — Telehealth: Payer: Self-pay | Admitting: Genetic Counselor

## 2022-05-25 NOTE — Telephone Encounter (Signed)
Scheduled appt per 10/24 referral. Pt is aware of appt date and time. Pt is aware to arrive 15 mins prior to appt time and to bring and updated insurance card. Pt is aware of appt location.   

## 2022-07-13 ENCOUNTER — Inpatient Hospital Stay: Payer: 59 | Attending: Genetic Counselor | Admitting: Genetic Counselor

## 2022-07-13 ENCOUNTER — Inpatient Hospital Stay: Payer: 59

## 2022-07-13 ENCOUNTER — Other Ambulatory Visit: Payer: Self-pay | Admitting: Genetic Counselor

## 2022-07-13 ENCOUNTER — Other Ambulatory Visit: Payer: Self-pay

## 2022-07-13 ENCOUNTER — Encounter: Payer: Self-pay | Admitting: Genetic Counselor

## 2022-07-13 DIAGNOSIS — Z8481 Family history of carrier of genetic disease: Secondary | ICD-10-CM

## 2022-07-13 DIAGNOSIS — Z803 Family history of malignant neoplasm of breast: Secondary | ICD-10-CM | POA: Diagnosis not present

## 2022-07-13 DIAGNOSIS — Z8042 Family history of malignant neoplasm of prostate: Secondary | ICD-10-CM | POA: Diagnosis not present

## 2022-07-13 DIAGNOSIS — Z8041 Family history of malignant neoplasm of ovary: Secondary | ICD-10-CM | POA: Diagnosis not present

## 2022-07-13 DIAGNOSIS — Z800 Family history of malignant neoplasm of digestive organs: Secondary | ICD-10-CM

## 2022-07-13 LAB — GENETIC SCREENING ORDER

## 2022-07-13 NOTE — Progress Notes (Addendum)
REFERRING PROVIDER: Marda Stalker, PA-C [place],  [place] [address]  PRIMARY PROVIDER:  Kathyrn Lass, MD  PRIMARY REASON FOR VISIT:  1. Family history of prostate cancer   2. Family history of breast cancer   3. Family history of pancreatic cancer   4. Family history of ovarian cancer   5. Family history of colon cancer   6. Family history of BRCA2 gene positive      HISTORY OF PRESENT ILLNESS:   Sara [name], a 41 y.o. female, was seen for a [place] cancer genetics consultation at the request of Marda Stalker due to a family history of cancer and a known BRCA2 mutation.  Sara [name] presents to clinic today to discuss the possibility of a hereditary predisposition to cancer, genetic testing, and to further clarify her future cancer risks, as well as potential cancer risks for family members.   Sara [name] is a 41 y.o. female with no personal history of cancer.    CANCER HISTORY:  Oncology History   No history exists.     RISK FACTORS:  Menarche was at age 46-14.  First live birth at age 42.  OCP use for approximately 3 years.  Ovaries intact: yes.  Hysterectomy: no.  Menopausal status: premenopausal.  HRT use: 0 years. Colonoscopy: no; not examined. Mammogram within the last year: no. Number of breast biopsies: 0. Up to date with pelvic exams: yes. Any excessive radiation exposure in the past: no  Past Medical History:  Diagnosis Date   Anxiety    Depression    Family history of breast cancer    Family history of colon cancer    Family history of ovarian cancer    Family history of pancreatic cancer    Family history of prostate cancer    Headache    Migraines    Past Surgical History:  Procedure Laterality Date   HYSTEROSCOPY WITH D & C N/A 03/15/2017   Procedure: DILATATION AND CURETTAGE /HYSTEROSCOPY;  Surgeon: Brien Few, MD;  Location: [facility];  Service: Gynecology;  Laterality: N/A;   HYSTEROSCOPY WITH NOVASURE N/A 03/15/2017    Procedure: HYSTEROSCOPY WITH NOVASURE;  Surgeon: Brien Few, MD;  Location: [facility];  Service: Gynecology;  Laterality: N/A;   WISDOM TOOTH EXTRACTION      Social History   Socioeconomic History   Marital status: Single    Spouse name: Not on file   Number of children: Not on file   Years of education: Not on file   Highest education level: Not on file  Occupational History   Not on file  Tobacco Use   Smoking status: Every Day    Packs/day: 0.50    Years: 15.00    Total pack years: 7.50    Types: Cigarettes   Smokeless tobacco: Never  Substance and Sexual Activity   Alcohol use: Yes    Comment: rare   Drug use: No   Sexual activity: Not on file  Other Topics Concern   Not on file  Social History Narrative   Not on file   Social Determinants of Health   Financial Resource Strain: Not on file  Food Insecurity: Not on file  Transportation Needs: Not on file  Physical Activity: Not on file  Stress: Not on file  Social Connections: Not on file     FAMILY HISTORY:  We obtained a detailed, 4-generation family history.  Significant diagnoses are listed below: Family History  Problem Relation Age of Onset  Ovarian cancer Paternal Aunt    Colon cancer Paternal Aunt    Breast cancer Maternal Grandmother    Prostate cancer Maternal Grandfather    Pancreatic cancer Paternal Grandfather    Breast cancer [name] 68       BRCA+; paternal [name]      The patient has one daughter who is cancer free.  She has a brother who is cancer free.  Both parents are living.  The patient's mother is 48 and cancer free. She has a brother and sister with no reported cancer diagnosis.  The maternal grandfather had prostate cancer and the grandmother had breast cancer.  The patient's father ha two sisters.  One sister had ovarian, colon and ureter cancer with a BRCA mutation on her somatic testing.  Germline testing confirmed that BRCA mutations.  The other sister does not have  cancer but has a daughter with breast caner at 40 and a known BRCA2 mutation.  The paternal grandfather died at 14 from pancreatic cancer.  Sara [name] is aware of previous family history of genetic testing for hereditary cancer risks. Patient's maternal ancestors are of [place], [place], [place] and [place] descent, and paternal ancestors are of [place] and [place] descent. There is no reported Ashkenazi Jewish ancestry. There is no known consanguinity.  GENETIC COUNSELING ASSESSMENT: Sara [name] is a 41 y.o. female with a family history of cancer which is somewhat suggestive of a hereditary cancer syndrome and predisposition to cancer given the combination of cancer and known BRCA mutation in the family. We, therefore, discussed and recommended the following at today's visit.   DISCUSSION: We discussed that, in general, most cancer is not inherited in families, but instead is sporadic or familial. Sporadic cancers occur by chance and typically happen at older ages (>50 years) as this type of cancer is caused by genetic changes acquired during an individual's lifetime. Some families have more cancers than would be expected by chance; however, the ages or types of cancer are not consistent with a known genetic mutation or known genetic mutations have been ruled out. This type of familial cancer is thought to be due to a combination of multiple genetic, environmental, hormonal, and lifestyle factors. While this combination of factors likely increases the risk of cancer, the exact source of this risk is not currently identifiable or testable.  We discussed that up to 20% of ovarian cancer and 5 - 10% of breast cancer is hereditary, with most cases associated with BRCA mutations.  There are other genes that can be associated with hereditary breast and ovarian cancer syndromes, but that having a known mutation suggests her greatest risk.  Based on her paternal aunt having a BRCA2 mutation, Sara [name] has a 25% chance of  having the same mutation.  We discussed that testing is beneficial for several reasons including knowing how to follow individuals after completing their treatment, identifying whether potential treatment options such as PARP inhibitors would be beneficial, and understand if other family members could be at risk for cancer and allow them to undergo genetic testing.   We discussed that some people do not want to undergo genetic testing due to fear of genetic discrimination.  A federal law called the Genetic Information Non-Discrimination Act (GINA) of 2008 helps protect individuals against genetic discrimination based on their genetic test results.  It impacts both health insurance and employment.  With health insurance, it protects against increased premiums, being kicked off insurance or being forced to take a test in order  to be insured.  For employment it protects against hiring, firing and promoting decisions based on genetic test results.  GINA does not apply to those in the [employer], those who work for companies with less than 15 employees, and new life insurance or long-term disability insurance policies.  Health status due to a cancer diagnosis is not protected under GINA.  We reviewed the characteristics, features and inheritance patterns of hereditary cancer syndromes. We also discussed genetic testing, including the appropriate family members to test, the process of testing, insurance coverage and turn-around-time for results. We discussed the implications of a negative, positive, carrier and/or variant of uncertain significant result. Sara [name]  was offered a common hereditary cancer panel (47 genes) and an expanded pan-cancer panel (77 genes). Sara [name] was informed of the benefits and limitations of each panel, including that expanded pan-cancer panels contain genes that do not have clear management guidelines at this point in time.  We also discussed that as the number of genes included on a  panel increases, the chances of variants of uncertain significance increases. Sara [name] decided to pursue genetic testing for the CancerNext-Expanded+RNAinsight gene panel.   The CancerNext-Expanded gene panel offered by [facility] and includes sequencing and rearrangement analysis for the following 77 genes: AIP, ALK, APC*, ATM*, AXIN2, BAP1, BARD1, BLM, BMPR1A, BRCA1*, BRCA2*, BRIP1*, CDC73, CDH1*, CDK4, CDKN1B, CDKN2A, CHEK2*, CTNNA1, DICER1, FANCC, FH, FLCN, GALNT12, KIF1B, LZTR1, MAX, MEN1, MET, MLH1*, MSH2*, MSH3, MSH6*, MUTYH*, NBN, NF1*, NF2, NTHL1, PALB2*, PHOX2B, PMS2*, POT1, PRKAR1A, PTCH1, PTEN*, RAD51C*, RAD51D*, RB1, RECQL, RET, SDHA, SDHAF2, SDHB, SDHC, SDHD, SMAD4, SMARCA4, SMARCB1, SMARCE1, STK11, SUFU, TMEM127, TP53*, TSC1, TSC2, VHL and XRCC2 (sequencing and deletion/duplication); EGFR, EGLN1, HOXB13, KIT, MITF, PDGFRA, POLD1, and POLE (sequencing only); EPCAM and GREM1 (deletion/duplication only). DNA and RNA analyses performed for * genes.    Based on Sara [name]'s family history of cancer, she meets medical criteria for genetic testing. Despite that she meets criteria, she may still have an out of pocket cost. We discussed that if her out of pocket cost for testing is over $100, the laboratory will call and confirm whether she wants to proceed with testing.  If the out of pocket cost of testing is less than $100 she will be billed by the genetic testing laboratory.   PLAN: After considering the risks, benefits, and limitations, Sara [name] provided informed consent to pursue genetic testing and the blood sample was sent to [facility] for analysis of the CancerNext-Expanded+RNAinsight. Results should be available within approximately 2-3 weeks' time, at which point they will be disclosed by telephone to Sara [name], as will any additional recommendations warranted by these results. Sara [name] will receive a summary of her genetic counseling visit and a copy of her  results once available. This information will also be available in Epic.   Based on Sara [name]'s family history, we recommended her father have genetic counseling and testing. Sara [name] will let us know if we can be of any assistance in coordinating genetic counseling and/or testing for this family member.   Lastly, we encouraged Sara [name] to remain in contact with cancer genetics annually so that we can continuously update the family history and inform her of any changes in cancer genetics and testing that may be of benefit for this family.   Sara [name] questions were answered to her satisfaction today. Our contact information was provided should additional questions or concerns arise. Thank you for the referral and allowing [place] to  share in the care of your patient.   Destyne Goodreau P. Florene Glen, [place], [facility] Licensed, Insurance risk surveyor [email or web address] phone: [phone]  The patient was seen for a total of 35 minutes in face-to-face genetic counseling.  The patient brought her daughter. Drs. Michell Heinrich, and/or [place] were available for questions, if needed..    _______________________________________________________________________ For Office Staff:  Number of people involved in session: 2 Was an Intern/ student involved with case: no

## 2022-07-28 ENCOUNTER — Other Ambulatory Visit: Payer: Medicaid Other

## 2022-07-28 ENCOUNTER — Encounter: Payer: Medicaid Other | Admitting: Genetic Counselor

## 2022-08-05 ENCOUNTER — Telehealth: Payer: Self-pay | Admitting: Genetic Counselor

## 2022-08-05 ENCOUNTER — Ambulatory Visit: Payer: Self-pay | Admitting: Genetic Counselor

## 2022-08-05 ENCOUNTER — Encounter: Payer: Self-pay | Admitting: Genetic Counselor

## 2022-08-05 DIAGNOSIS — Z1379 Encounter for other screening for genetic and chromosomal anomalies: Secondary | ICD-10-CM | POA: Insufficient documentation

## 2022-08-05 NOTE — Progress Notes (Signed)
HPI:  Ms. Hemmer was previously seen in the [facility] clinic due to a family history of cancer and concerns regarding a hereditary predisposition to cancer. Please refer to our prior cancer genetics clinic note for more information regarding our discussion, assessment and recommendations, at the time. Ms. Cassata recent genetic test results were disclosed to her, as were recommendations warranted by these results. These results and recommendations are discussed in more detail below.  CANCER HISTORY:  Oncology History   No history exists.    FAMILY HISTORY:  We obtained a detailed, 4-generation family history.  Significant diagnoses are listed below: Family History  Problem Relation Age of Onset   Ovarian cancer Paternal Aunt    Colon cancer Paternal Aunt    Breast cancer Maternal Grandmother    Prostate cancer Maternal Grandfather    Pancreatic cancer Paternal Grandfather    Breast cancer Cousin 27       BRCA+; paternal cousin         The patient has one daughter who is cancer free.  She has a brother who is cancer free.  Both parents are living.   The patient's mother is 98 and cancer free. She has a brother and sister with no reported cancer diagnosis.  The maternal grandfather had prostate cancer and the grandmother had breast cancer.   The patient's father does not have cancer and is currently awaiting his genetic testing results.  He has two sisters.  One sister had ovarian, colon and ureter cancer with a BRCA mutation on her somatic testing.  Germline testing confirmed that BRCA mutations.  The other sister does not have cancer but has a daughter with breast caner at 87 and a known BRCA2 mutation.  The paternal grandfather died at 91 from pancreatic cancer.   Ms. Yahnke is aware of previous family history of genetic testing for hereditary cancer risks. Patient's maternal ancestors are of [place], [place], [place] and [place] descent, and paternal ancestors are of  [place] and [place] descent. There is no reported Ashkenazi Jewish ancestry. There is no known consanguinity  GENETIC TEST RESULTS: Genetic testing reported out on August 04, 2022 through the CancerNext-Expanded+RNAinsight cancer panel found no pathogenic mutations. The CancerNext-Expanded gene panel offered by [facility] and includes sequencing and rearrangement analysis for the following 77 genes: AIP, ALK, APC*, ATM*, AXIN2, BAP1, BARD1, BLM, BMPR1A, BRCA1*, BRCA2*, BRIP1*, CDC73, CDH1*, CDK4, CDKN1B, CDKN2A, CHEK2*, CTNNA1, DICER1, FANCC, FH, FLCN, GALNT12, KIF1B, LZTR1, MAX, MEN1, MET, MLH1*, MSH2*, MSH3, MSH6*, MUTYH*, NBN, NF1*, NF2, NTHL1, PALB2*, PHOX2B, PMS2*, POT1, PRKAR1A, PTCH1, PTEN*, RAD51C*, RAD51D*, RB1, RECQL, RET, SDHA, SDHAF2, SDHB, SDHC, SDHD, SMAD4, SMARCA4, SMARCB1, SMARCE1, STK11, SUFU, TMEM127, TP53*, TSC1, TSC2, VHL and XRCC2 (sequencing and deletion/duplication); EGFR, EGLN1, HOXB13, KIT, MITF, PDGFRA, POLD1, and POLE (sequencing only); EPCAM and GREM1 (deletion/duplication only). DNA and RNA analyses performed for * genes. The test report has been scanned into EPIC and is located under the Molecular Pathology section of the Results Review tab.  A portion of the result report is included below for reference.      Ms. Reiger test was normal and did not reveal the familial mutation. We call this result a true negative result because the cancer-causing mutation was identified in Ms. Vanessen's family, and she did not inherit it.  Given this negative result, Ms. Pinkus's chances of developing BRCA2-related cancers are the same as they are in the general population.    ADDITIONAL GENETIC TESTING: We discussed with Ms. Ysidro Evert  that her genetic testing was fairly extensive.  If there are genes identified to increase cancer risk that can be analyzed in the future, we would be happy to discuss and coordinate this testing at that time.    CANCER SCREENING RECOMMENDATIONS: Ms. Buescher  test result is considered negative (normal).  While reassuring, this does not definitively rule out a hereditary predisposition to cancer. It is still possible that there could be genetic mutations that are undetectable by current technology. There could be genetic mutations in genes that have not been tested or identified to increase cancer risk.  Therefore, it is recommended she continue to follow the cancer management and screening guidelines provided by her primary healthcare provider.   An individual's cancer risk and medical management are not determined by genetic test results alone. Overall cancer risk assessment incorporates additional factors, including personal medical history, family history, and any available genetic information that may result in a personalized plan for cancer prevention and surveillance  RECOMMENDATIONS FOR FAMILY MEMBERS:  Individuals in this family might be at some increased risk of developing cancer, over the general population risk, simply due to the family history of cancer.  We recommended women in this family have a yearly mammogram beginning at age 30, or 30 years younger than the earliest onset of cancer, an annual clinical breast exam, and perform monthly breast self-exams. Women in this family should also have a gynecological exam as recommended by their primary provider. All family members should be referred for colonoscopy starting at age 52.  FOLLOW-UP: Lastly, we discussed with Ms. Lapine that cancer genetics is a rapidly advancing field and it is possible that new genetic tests will be appropriate for her and/or her family members in the future. We encouraged her to remain in contact with cancer genetics on an annual basis so we can update her personal and family histories and let her know of advances in cancer genetics that may benefit this family.   Our contact number was provided. Ms. Mersch questions were answered to her satisfaction, and she knows she is  welcome to call us at anytime with additional questions or concerns.   Roma Kayser, [place], [facility] Licensed, Certified Genetic Counselor [email or web address]

## 2022-08-05 NOTE — Telephone Encounter (Signed)
Revealed that patient had negative genetic testing.  This is considered a true negative, since a family member has a known hereditary mutation and the patient does not have it.  We do not need to test her daughter at this time.  Patient voiced her understanding.

## 2023-08-30 ENCOUNTER — Other Ambulatory Visit: Payer: Self-pay | Admitting: Family Medicine

## 2023-08-30 DIAGNOSIS — N6322 Unspecified lump in the left breast, upper inner quadrant: Secondary | ICD-10-CM

## 2023-09-05 ENCOUNTER — Ambulatory Visit
Admission: RE | Admit: 2023-09-05 | Discharge: 2023-09-05 | Disposition: A | Discharge status: Home / Self Care | Payer: BC Managed Care – PPO | Source: Ambulatory Visit | Attending: Family Medicine | Admitting: Family Medicine

## 2023-09-05 ENCOUNTER — Ambulatory Visit
Admission: RE | Admit: 2023-09-05 | Discharge: 2023-09-05 | Disposition: A | Discharge status: Home / Self Care | Payer: 59 | Source: Ambulatory Visit | Attending: Family Medicine | Admitting: Family Medicine

## 2023-09-05 DIAGNOSIS — N6322 Unspecified lump in the left breast, upper inner quadrant: Secondary | ICD-10-CM

## 2023-09-27 ENCOUNTER — Other Ambulatory Visit: Payer: 59
